# Patient Record
Sex: Female | Born: 1965 | Race: White | Hispanic: No | Marital: Married
Health system: Southern US, Community
[De-identification: ages and names within clinical notes are randomized; demographics above are authoritative.]

---

## 2004-09-30 ENCOUNTER — Ambulatory Visit: Payer: Self-pay | Admitting: Internal Medicine

## 2010-05-07 ENCOUNTER — Ambulatory Visit: Payer: Self-pay | Admitting: Unknown Physician Specialty

## 2010-05-14 ENCOUNTER — Ambulatory Visit: Payer: Self-pay | Admitting: Unknown Physician Specialty

## 2010-12-15 ENCOUNTER — Ambulatory Visit: Payer: Self-pay | Admitting: Unknown Physician Specialty

## 2011-05-18 ENCOUNTER — Ambulatory Visit: Payer: Self-pay | Admitting: Internal Medicine

## 2012-07-28 ENCOUNTER — Ambulatory Visit: Payer: Self-pay | Admitting: Internal Medicine

## 2013-07-31 ENCOUNTER — Ambulatory Visit: Payer: Self-pay | Admitting: Nurse Practitioner

## 2014-08-08 ENCOUNTER — Ambulatory Visit: Payer: Self-pay | Admitting: Internal Medicine

## 2015-07-17 ENCOUNTER — Other Ambulatory Visit: Payer: Self-pay | Admitting: Nurse Practitioner

## 2015-07-17 DIAGNOSIS — Z1231 Encounter for screening mammogram for malignant neoplasm of breast: Secondary | ICD-10-CM

## 2015-08-12 ENCOUNTER — Ambulatory Visit
Admission: RE | Admit: 2015-08-12 | Discharge: 2015-08-12 | Disposition: A | Discharge status: Home / Self Care | Payer: BLUE CROSS/BLUE SHIELD | Source: Ambulatory Visit | Attending: Nurse Practitioner | Admitting: Nurse Practitioner

## 2015-08-12 DIAGNOSIS — Z1231 Encounter for screening mammogram for malignant neoplasm of breast: Secondary | ICD-10-CM | POA: Diagnosis not present

## 2016-07-30 ENCOUNTER — Other Ambulatory Visit: Payer: Self-pay | Admitting: Nurse Practitioner

## 2016-07-30 ENCOUNTER — Other Ambulatory Visit: Payer: Self-pay | Admitting: Family Medicine

## 2016-07-30 DIAGNOSIS — Z1231 Encounter for screening mammogram for malignant neoplasm of breast: Secondary | ICD-10-CM

## 2016-08-27 ENCOUNTER — Ambulatory Visit
Admission: RE | Admit: 2016-08-27 | Discharge: 2016-08-27 | Disposition: A | Discharge status: Home / Self Care | Payer: BLUE CROSS/BLUE SHIELD | Source: Ambulatory Visit | Attending: Family Medicine | Admitting: Family Medicine

## 2016-08-27 DIAGNOSIS — Z1231 Encounter for screening mammogram for malignant neoplasm of breast: Secondary | ICD-10-CM | POA: Diagnosis not present

## 2017-08-04 ENCOUNTER — Other Ambulatory Visit: Payer: Self-pay | Admitting: Family Medicine

## 2017-08-05 ENCOUNTER — Other Ambulatory Visit: Payer: Self-pay | Admitting: Family Medicine

## 2017-08-05 DIAGNOSIS — Z1231 Encounter for screening mammogram for malignant neoplasm of breast: Secondary | ICD-10-CM

## 2017-09-06 ENCOUNTER — Ambulatory Visit
Admission: RE | Admit: 2017-09-06 | Discharge: 2017-09-06 | Disposition: A | Discharge status: Home / Self Care | Payer: BLUE CROSS/BLUE SHIELD | Source: Ambulatory Visit | Attending: Family Medicine | Admitting: Family Medicine

## 2017-09-06 DIAGNOSIS — Z1231 Encounter for screening mammogram for malignant neoplasm of breast: Secondary | ICD-10-CM | POA: Diagnosis present

## 2018-04-25 ENCOUNTER — Ambulatory Visit: Payer: Self-pay

## 2018-08-08 ENCOUNTER — Other Ambulatory Visit: Payer: Self-pay | Admitting: Family Medicine

## 2018-08-08 DIAGNOSIS — Z1231 Encounter for screening mammogram for malignant neoplasm of breast: Secondary | ICD-10-CM

## 2018-09-12 ENCOUNTER — Ambulatory Visit
Admission: RE | Admit: 2018-09-12 | Discharge: 2018-09-12 | Disposition: A | Discharge status: Home / Self Care | Payer: BLUE CROSS/BLUE SHIELD | Source: Ambulatory Visit | Attending: Family Medicine | Admitting: Family Medicine

## 2018-09-12 DIAGNOSIS — Z1231 Encounter for screening mammogram for malignant neoplasm of breast: Secondary | ICD-10-CM | POA: Diagnosis not present

## 2019-08-15 ENCOUNTER — Other Ambulatory Visit: Payer: Self-pay | Admitting: Family Medicine

## 2019-08-15 DIAGNOSIS — Z1231 Encounter for screening mammogram for malignant neoplasm of breast: Secondary | ICD-10-CM

## 2019-09-14 ENCOUNTER — Ambulatory Visit
Admission: RE | Admit: 2019-09-14 | Discharge: 2019-09-14 | Disposition: A | Discharge status: Home / Self Care | Payer: BC Managed Care – PPO | Source: Ambulatory Visit | Attending: Family Medicine | Admitting: Family Medicine

## 2019-09-14 DIAGNOSIS — Z1231 Encounter for screening mammogram for malignant neoplasm of breast: Secondary | ICD-10-CM

## 2019-11-03 ENCOUNTER — Ambulatory Visit: Payer: Self-pay | Attending: Internal Medicine

## 2019-11-03 DIAGNOSIS — Z23 Encounter for immunization: Secondary | ICD-10-CM

## 2019-11-03 NOTE — Progress Notes (Signed)
   Cov[facility] Vaccination [facility]  Name:  Sheryl Schroeder    MRN: [id number] DOB: [birth year]  11/03/2019  Sheryl Schroeder was observed post Covid-19 immunization for 15 minutes without incident. She was provided with Vaccine Information Sheet and instruction to access the V-Safe system.   Sheryl Schroeder was instructed to call 911 with any severe reactions post vaccine: Marland Kitchen Difficulty breathing  . Swelling of face and throat  . A fast heartbeat  . A bad rash all over body  . Dizziness and weakness   Immunizations Administered    Name Date Dose VIS Date Route   Moderna COVID-19 Vaccine 11/03/2019  9:54 AM 0.5 mL 07/03/2019 Intramuscular   Manufacturer: Gala Murdoch   Lot: [id number]   NDC: 61164-353-91

## 2020-08-08 ENCOUNTER — Other Ambulatory Visit: Payer: Self-pay | Admitting: Family Medicine

## 2020-09-02 ENCOUNTER — Other Ambulatory Visit: Payer: Self-pay | Admitting: Family Medicine

## 2020-09-02 DIAGNOSIS — Z1231 Encounter for screening mammogram for malignant neoplasm of breast: Secondary | ICD-10-CM

## 2020-09-23 ENCOUNTER — Ambulatory Visit
Admission: RE | Admit: 2020-09-23 | Discharge: 2020-09-23 | Disposition: A | Discharge status: Home / Self Care | Payer: 59 | Source: Ambulatory Visit | Attending: Family Medicine | Admitting: Family Medicine

## 2020-09-23 ENCOUNTER — Other Ambulatory Visit: Payer: Self-pay

## 2020-09-23 DIAGNOSIS — Z1231 Encounter for screening mammogram for malignant neoplasm of breast: Secondary | ICD-10-CM | POA: Diagnosis not present

## 2021-08-05 DIAGNOSIS — J069 Acute upper respiratory infection, unspecified: Secondary | ICD-10-CM | POA: Diagnosis not present

## 2021-08-24 ENCOUNTER — Other Ambulatory Visit: Payer: Self-pay | Admitting: Family Medicine

## 2021-08-24 DIAGNOSIS — Z1231 Encounter for screening mammogram for malignant neoplasm of breast: Secondary | ICD-10-CM

## 2021-09-08 DIAGNOSIS — E7800 Pure hypercholesterolemia, unspecified: Secondary | ICD-10-CM | POA: Diagnosis not present

## 2021-09-08 DIAGNOSIS — Z79899 Other long term (current) drug therapy: Secondary | ICD-10-CM | POA: Diagnosis not present

## 2021-09-15 DIAGNOSIS — Z1331 Encounter for screening for depression: Secondary | ICD-10-CM | POA: Diagnosis not present

## 2021-09-15 DIAGNOSIS — Z79899 Other long term (current) drug therapy: Secondary | ICD-10-CM | POA: Diagnosis not present

## 2021-09-15 DIAGNOSIS — K219 Gastro-esophageal reflux disease without esophagitis: Secondary | ICD-10-CM | POA: Diagnosis not present

## 2021-09-15 DIAGNOSIS — Z0000 Encounter for general adult medical examination without abnormal findings: Secondary | ICD-10-CM | POA: Diagnosis not present

## 2021-09-15 DIAGNOSIS — E7800 Pure hypercholesterolemia, unspecified: Secondary | ICD-10-CM | POA: Diagnosis not present

## 2021-09-25 ENCOUNTER — Ambulatory Visit
Admission: RE | Admit: 2021-09-25 | Discharge: 2021-09-25 | Disposition: A | Discharge status: Home / Self Care | Payer: 59 | Source: Ambulatory Visit | Attending: Family Medicine | Admitting: Family Medicine

## 2021-09-25 ENCOUNTER — Other Ambulatory Visit: Payer: Self-pay

## 2021-09-25 DIAGNOSIS — Z1231 Encounter for screening mammogram for malignant neoplasm of breast: Secondary | ICD-10-CM | POA: Insufficient documentation

## 2022-08-25 ENCOUNTER — Other Ambulatory Visit: Payer: Self-pay | Admitting: Family Medicine

## 2022-08-25 DIAGNOSIS — Z1231 Encounter for screening mammogram for malignant neoplasm of breast: Secondary | ICD-10-CM

## 2022-09-27 ENCOUNTER — Ambulatory Visit
Admission: RE | Admit: 2022-09-27 | Discharge: 2022-09-27 | Disposition: A | Discharge status: Home / Self Care | Payer: 59 | Source: Ambulatory Visit | Attending: Family Medicine | Admitting: Family Medicine

## 2022-09-27 DIAGNOSIS — Z1231 Encounter for screening mammogram for malignant neoplasm of breast: Secondary | ICD-10-CM

## 2023-08-16 ENCOUNTER — Other Ambulatory Visit: Payer: Self-pay | Admitting: Family Medicine

## 2023-08-16 DIAGNOSIS — Z1231 Encounter for screening mammogram for malignant neoplasm of breast: Secondary | ICD-10-CM

## 2023-09-29 ENCOUNTER — Ambulatory Visit
Admission: RE | Admit: 2023-09-29 | Discharge: 2023-09-29 | Disposition: A | Discharge status: Home / Self Care | Payer: 59 | Source: Ambulatory Visit | Attending: Family Medicine | Admitting: Family Medicine

## 2023-09-29 DIAGNOSIS — Z1231 Encounter for screening mammogram for malignant neoplasm of breast: Secondary | ICD-10-CM | POA: Diagnosis present

## 2023-09-30 ENCOUNTER — Other Ambulatory Visit: Payer: Self-pay | Admitting: Medical Genetics

## 2023-10-04 ENCOUNTER — Other Ambulatory Visit
Admission: RE | Admit: 2023-10-04 | Discharge: 2023-10-04 | Disposition: A | Discharge status: Home / Self Care | Payer: Self-pay | Source: Ambulatory Visit | Attending: Medical Genetics | Admitting: Medical Genetics

## 2023-10-14 LAB — GENECONNECT MOLECULAR SCREEN: Genetic Analysis Overall Interpretation: NEGATIVE

## 2024-02-23 IMAGING — MG MM DIGITAL SCREENING BILAT W/ TOMO AND CAD
8 series · 9 of 24 positions shown · non-contrast
Comparison: Previous exam(s).

CLINICAL DATA: Screening.

EXAM:
DIGITAL SCREENING BILATERAL MAMMOGRAM WITH TOMOSYNTHESIS AND CAD
TECHNIQUE: Bilateral screening digital craniocaudal and mediolateral oblique
mammograms were obtained. Bilateral screening digital breast
tomosynthesis was performed. The images were evaluated with
computer-aided detection.

[R MLO synth-2D]
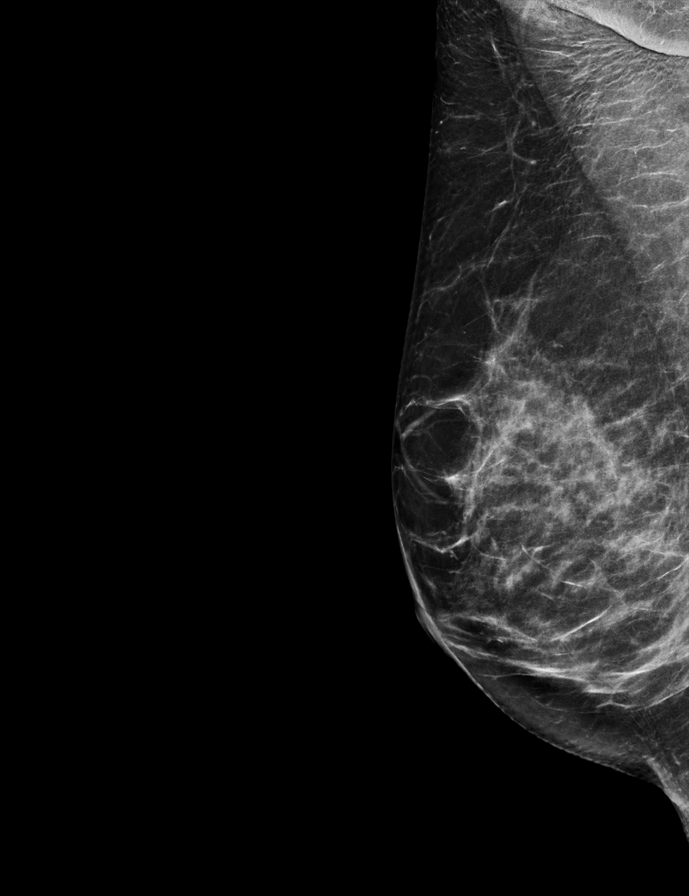

[L MLO synth-2D]
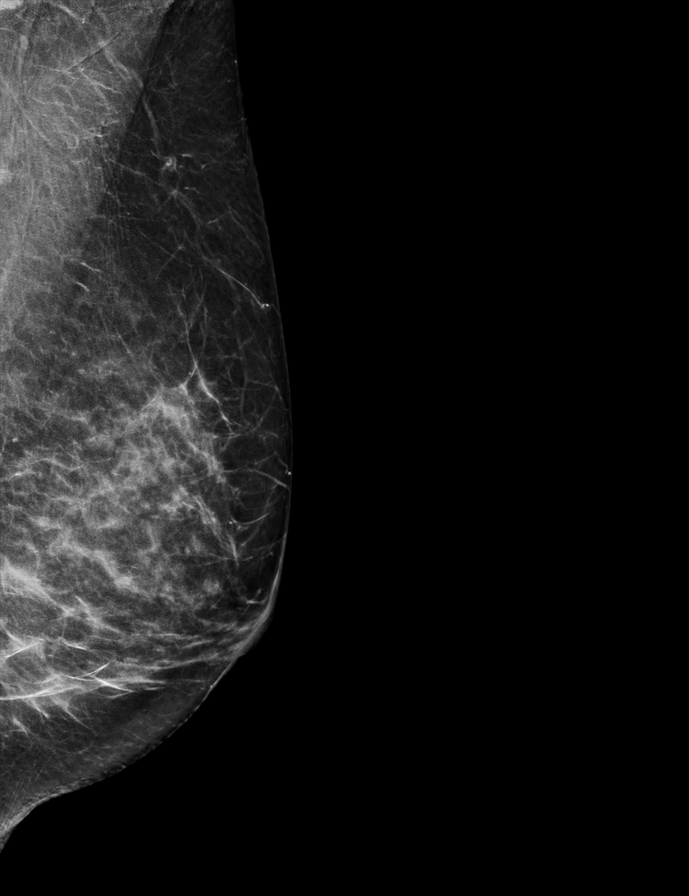

[R CC synth-2D]
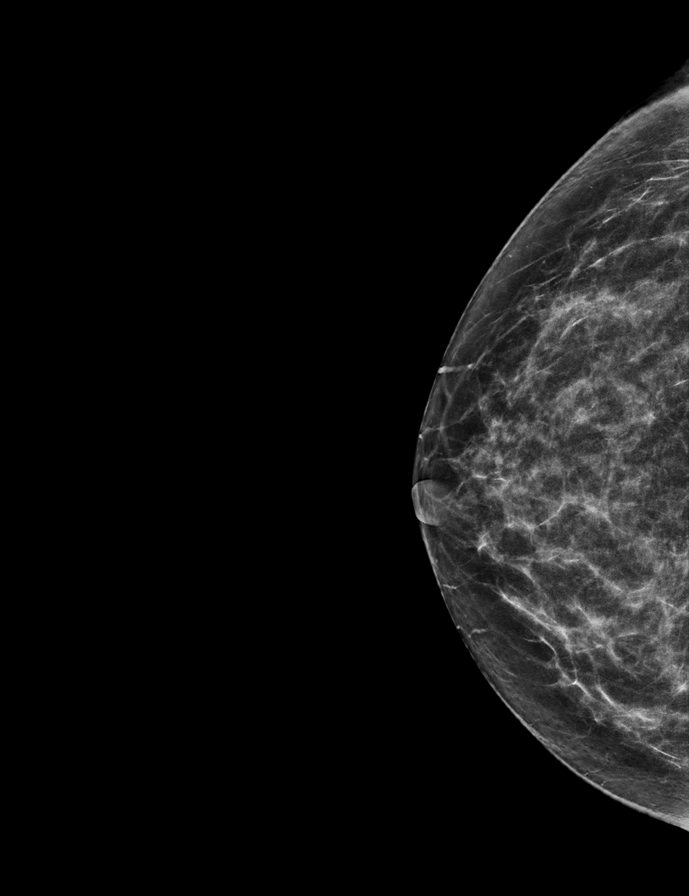

[L CC synth-2D]
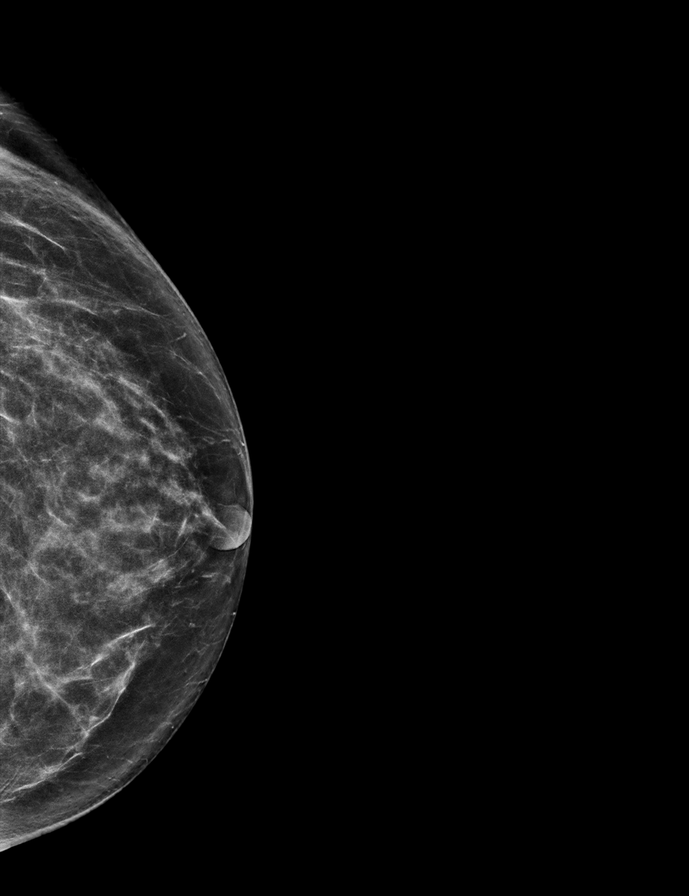

[R MLO tomo · 2 of 58 frames shown]
[frame 19/58]
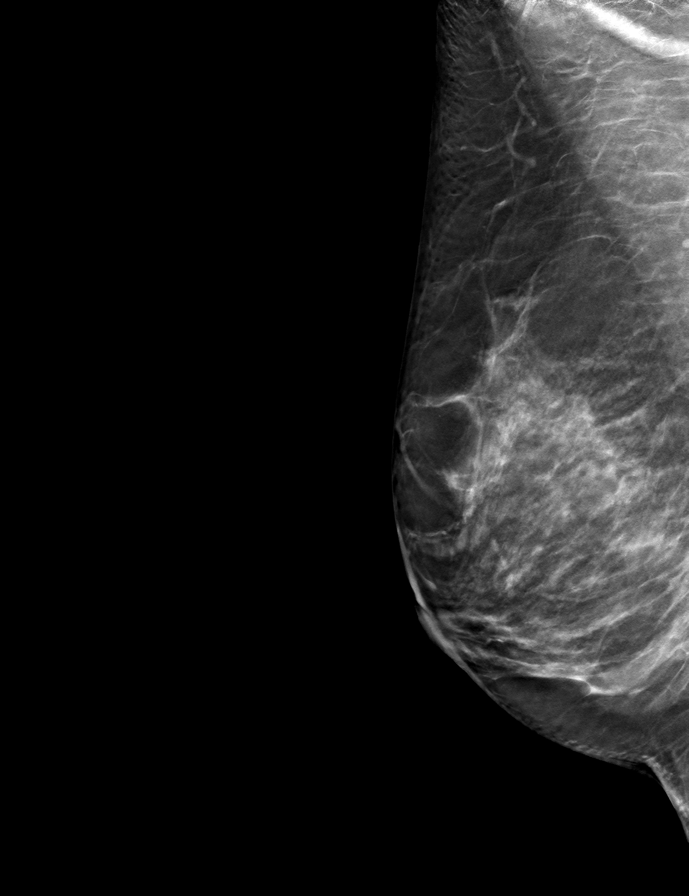
[frame 29/58]
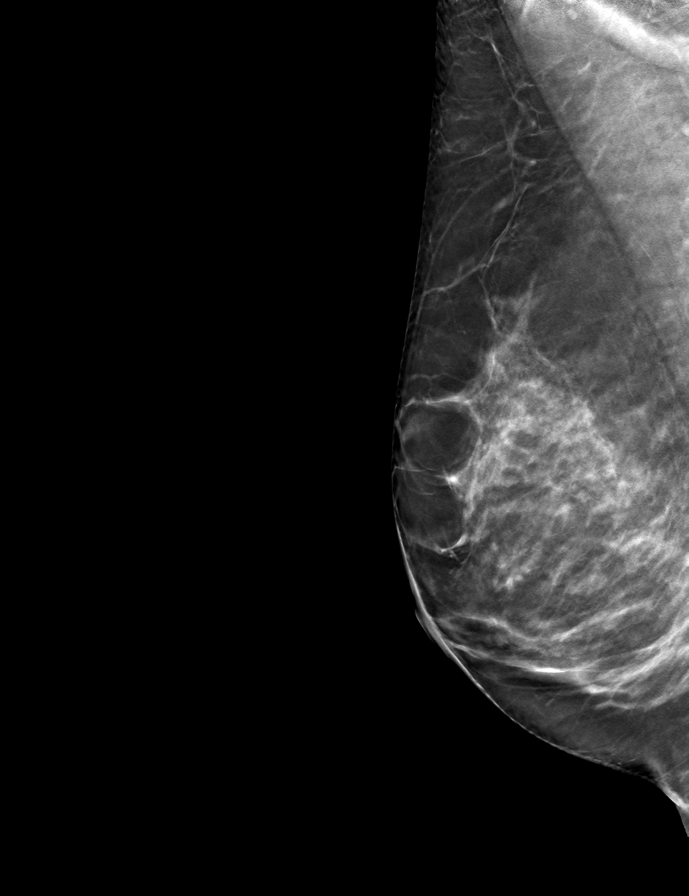

[R CC tomo · tomo slice 29/58.0]
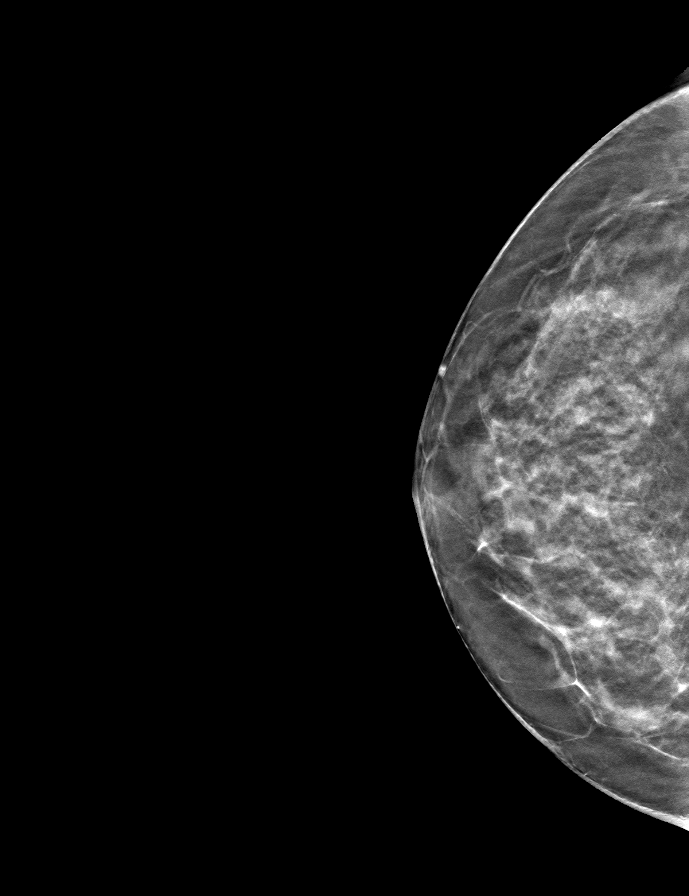

[L MLO tomo · tomo slice 29/57.0]
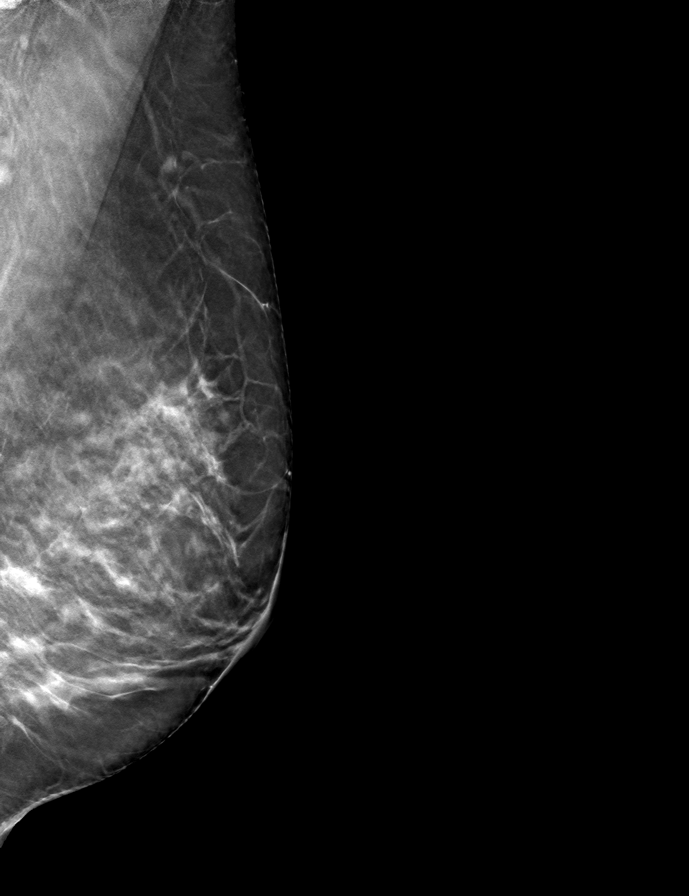

[L CC tomo · tomo slice 35/68.0]
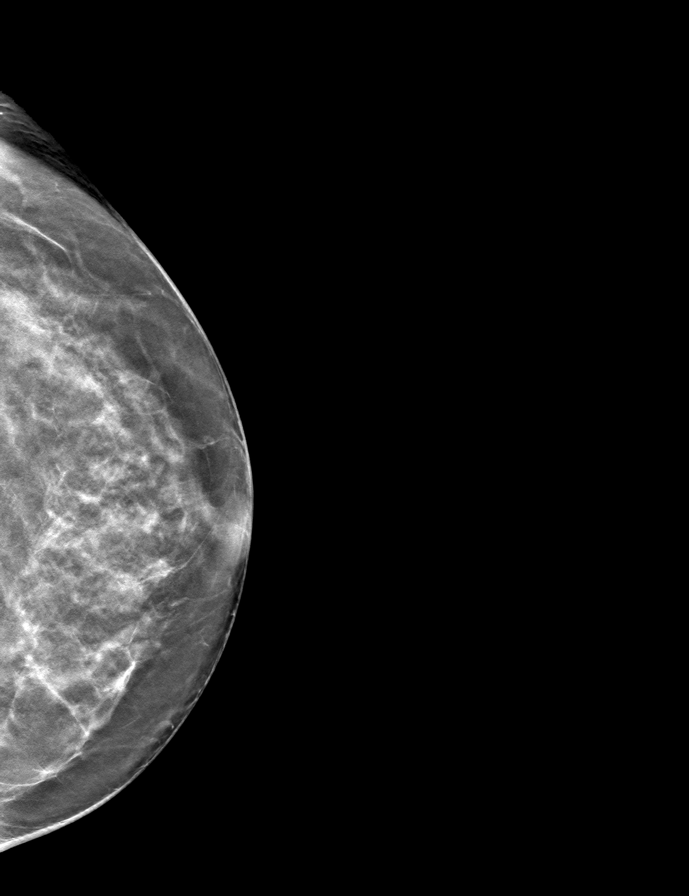

[9 of 24 positions shown; findings below may reference images not displayed]

ACR Breast Density Category c: The breast tissue is heterogeneously
dense, which may obscure small masses.
FINDINGS: There are no findings suspicious for malignancy.
IMPRESSION: No mammographic evidence of malignancy. A result letter of this
screening mammogram will be mailed directly to the patient.

RECOMMENDATION:
Screening mammogram in one year. (Code:Q3-W-BC3)

BI-RADS CATEGORY  1: Negative.

## 2024-09-03 ENCOUNTER — Other Ambulatory Visit: Payer: Self-pay | Admitting: Family Medicine

## 2024-09-03 DIAGNOSIS — Z1231 Encounter for screening mammogram for malignant neoplasm of breast: Secondary | ICD-10-CM

## 2024-10-01 ENCOUNTER — Encounter
